# Patient Record
Sex: Female | Born: 1995 | Race: White | Hispanic: No | Marital: Single | Smoking: Never smoker
Health system: Southern US, Community
[De-identification: ages and names within clinical notes are randomized; demographics above are authoritative.]

## PROBLEM LIST (undated history)

## (undated) DIAGNOSIS — D1801 Hemangioma of skin and subcutaneous tissue: Secondary | ICD-10-CM

## (undated) DIAGNOSIS — J45909 Unspecified asthma, uncomplicated: Secondary | ICD-10-CM

## (undated) HISTORY — DX: Unspecified asthma, uncomplicated: J45.909

## (undated) HISTORY — DX: Hemangioma of skin and subcutaneous tissue: D18.01

---
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
## 2017-01-04 ENCOUNTER — Telehealth: Payer: Self-pay | Admitting: Emergency Medicine

## 2017-01-04 MED ORDER — NORETHIN ACE-ETH ESTRAD-FE 1.5-30 MG-MCG PO TABS: 1.0000 | ORAL_TABLET | Freq: Every day | ORAL | 0 refills | Status: DC

## 2017-01-04 NOTE — Telephone Encounter (Signed)
Pt is aware only one pack of bc call into pharm. Pt will make an appt

## 2017-01-04 NOTE — Telephone Encounter (Signed)
Left a voicemail for pt to give the office a call back in reference to needing and appointment to f/u from last OV. Send in birthcontrol but in order for pt to receive further refills she needs an appointment.

## 2017-01-30 ENCOUNTER — Telehealth: Payer: Self-pay | Admitting: Internal Medicine

## 2017-01-30 NOTE — Telephone Encounter (Signed)
Per Dr.Panosh it is okay to schedule pt for that day

## 2017-01-30 NOTE — Telephone Encounter (Signed)
Pt scheduled  

## 2017-01-30 NOTE — Telephone Encounter (Signed)
MAILBOX full

## 2017-01-30 NOTE — Telephone Encounter (Signed)
Pt will be home from college on 02-08-17. Pt is out of bcp and will needs a cpx. Can I use wcc slot  on 02-09-17 ?

## 2017-02-08 NOTE — Progress Notes (Signed)
Chief Complaint  Patient presents with  . Annual Exam    HPI: Patient  Lori Pineda  21 y.o. comes in today for Preventive Health Care visit  Asthma  As needed   q oweek  Pre exervcise  OCPs helpful for dysmenorrhea. 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 it went well this summer.work   Stage manager Date Due  . HIV Screening  02/09/2018 (Originally 12/19/2012)  . INFLUENZA VACCINE  05/03/2017  . TETANUS/TDAP  01/07/2019   Health Maintenance Review LIFESTYLE:  Exercise:   3 d per week  Tobacco/ETS:n Alcohol: n Sugar beverages: no Sleep: 7-8 Drug use: no HH of  Summer 5  To live apt .  Work:  H (Home) Family Relationships: good Communication: good with parents Responsibilities: has responsibilities at home  E (Education): Grades:  Good  School: good attendance  major forensics    A (Auton/Safety) Auto: wears seat belt Bike: does not ride Safety: can swim  D (Diet) Diet: not high risk r Risky eating habits: none Intake: adequate iron and calcium intake Body Image: good   Drugs Tobacco: No Alcohol: No Drugs: No  Sex Activity: abstinent Periods  Reg  And better   4 days  .  Suicide Risk Emotions: healthy Depression: denies feelings of depression Suicidal: denies suicidal ideation    ROS:  GEN/ HEENT: No fever, significant weight changes sweats headaches vision problems hearing changes, CV/ PULM; No chest pain shortness of breath cough, syncope,edema  change in exercise tolerance. GI /GU: No adominal pain, vomiting, change in bowel habits. No blood in the stool. No significant GU symptoms. SKIN/HEME: ,no acute skin rashes suspicious lesions or bleeding. No lymphadenopathy, nodules, masses.  NEURO/ PSYCH:  No neurologic signs such as weakness numbness. No depression anxiety. IMM/ Allergy: No unusual infections.  Allergy .   hasnt seen allergist for a while doing well.  REST of 12 system review  negative except as per HPI   Past Medical History:  Diagnosis Date  . Allergic asthma 04/30/2011   Evaluated by allergy and on inhaler as needed and Singulair.  A candidate for shots .   Marland Kitchen Hemangioma of eyelid   . Twin, born in hospital     History reviewed. No pertinent surgical history.  Family History  Problem Relation Age of Onset  . Multiple sclerosis Mother     Social History   Social History  . Marital status: Single    Spouse name: N/A  . Number of children: N/A  . Years of education: N/A   Social History Main Topics  . Smoking status: Never Smoker  . Smokeless tobacco: Never Used  . Alcohol use No  . Drug use: No  . Sexual activity: No   Other Topics Concern  . None   Social History Narrative   HH of 5   Intact family   [facility]    Outpatient Medications Prior to Visit  Medication Sig Dispense Refill  . albuterol (PROVENTIL HFA;VENTOLIN HFA) 108 (90 Base) MCG/ACT inhaler Inhale 2 puffs into the lungs every 6 (six) hours as needed for wheezing or shortness of breath. 1 Inhaler 2  . budesonide-formoterol (SYMBICORT) 160-4.5 MCG/ACT inhaler Inhale 2 puffs into the lungs 2 (two) times daily. 1 Inhaler 5  . norethindrone-ethinyl estradiol-iron (MICROGESTIN FE,GILDESS FE,LOESTRIN FE) 1.5-30 MG-MCG tablet Take 1 tablet by mouth daily. 1 Package 0   No facility-administered medications prior to visit.      EXAM:  BP 118/70 (BP Location: Right Arm, Patient Position: Sitting, Cuff Size: Normal)   Pulse 67   Temp 97.5 F (36.4 C) (Oral)   Ht _0  (1.702 m)   Wt 155 lb 14.4 oz (70.7 kg)   LMP 02/08/2017   BMI 24.42 kg/m   Body mass index is 24.42 kg/m. Wt Readings from Last 3 Encounters:  02/09/17 155 lb 14.4 oz (70.7 kg) (86 %, Z= 1.07)*  05/12/16 159 lb 6.4 oz (72.3 kg) (89 %, Z= 1.23)*  10/21/15 150 lb (68 kg) (85 %, Z= 1.02)*   * Growth percentiles are based on CDC 2-20 Years data.    Physical Exam: Vital signs reviewed SHU:OHFG is a  well-developed well-nourished alert cooperative    who appearsr stated age in no acute distress.  HEENT: normocephalic atraumatic , Eyes: PERRL EOM's full, conjunctiva clear, Nares: paten,t no deformity discharge or tenderness., Ears: no deformity EAC's clear TMs with normal landmarks. Mouth: clear OP, no lesions, edema.  Moist mucous membranes. Dentition in adequate repair. NECK: supple without masses, thyromegaly or bruits. CHEST/PULM:  Clear to auscultation and percussion breath sounds equal no wheeze , rales or rhonchi. No chest wall deformities or tenderness. Breast: normal by inspection . No dimpling, discharge, masses, tenderness or discharge . CV: PMI is nondisplaced, S1 S2 no gallops, murmurs, rubs. Peripheral pulses are full without delay.No JVD .  ABDOMEN: Bowel sounds normal nontender  No guard or rebound, no hepato splenomegal no CVA tenderness.  No hernia. Extremtities:  No clubbing cyanosis or edema, no acute joint swelling or redness no focal atrophy NEURO:  Oriented x3, cranial nerves 3-12 appear to be intact, no obvious focal weakness,gait within normal limits no abnormal reflexes or asymmetrical SKIN: No acute rashes normal turgor, color, no bruising or petechiae. PSYCH: Oriented, good eye contact, no obvious depression anxiety, cognition and judgment appear normal. LN: no cervical axillary inguinal adenopathy    BP Readings from Last 3 Encounters:  02/09/17 118/70  05/12/16 120/60  10/21/15 118/75    Lab results reviewed with patient   ASSESSMENT AND PLAN:  Discussed the following assessment and plan:  Visit for preventive health examination - Plan: CBC with Differential/Platelet, Lipid panel, Comprehensive metabolic panel  Family history of diabetes mellitus - Plan: Lipid panel, Comprehensive metabolic panel  Need for lipid screening - Plan: Lipid panel  Medication management - cont ocp use  and  meds as discussed  - Plan: CBC with Differential/Platelet, Lipid  panel, Comprehensive metabolic panel Lipid blood sugar anemia screening today if okay then as indicated. Continue attention to healthy diet and exercise and getting enough sleep. Skin cancer prevention for the summer. Checkup in a year or as needed. Her immunizations appear to be up-to-date. Have pharmacy Contact us for refill med as needed  Patient Care Team: Rainen Vanrossum, Standley Brooking, MD as PCP - General Mosetta Anis, MD (Allergy) Patient Instructions   Will notify you  of labs when available.  Sign up for my chart may be helpful for communication.  Continue lifestyle intervention healthy eating and exercise .   Healthy lifestyle includes : At least 150 minutes of exercise weeks  , weight at healthy levels, which is usually   BMI 19-25. Avoid trans fats and processed foods;  Increase fresh fruits and veges to 5 servings per day. And avoid sweet beverages including tea and juice. Mediterranean diet with olive oil and nuts have been noted to be heart and brain healthy . Avoid tobacco products . Limit  alcohol to  7 per week for women and 14 servings for men.  Get adequate sleep . Wear seat belts . Don't text and drive .   Health Maintenance, Female Adopting a healthy lifestyle and getting preventive care can go a long way to promote health and wellness. Talk with your health care provider about what schedule of regular examinations is right for you. This is a good chance for you to check in with your provider about disease prevention and staying healthy. In between checkups, there are plenty of things you can do on your own. Experts have done a lot of research about which lifestyle changes and preventive measures are most likely to keep you healthy. Ask your health care provider for more information. Weight and diet Eat a healthy diet  Be sure to include plenty of vegetables, fruits, low-fat dairy products, and lean protein.  Do not eat a lot of foods high in solid fats, added sugars, or  salt.  Get regular exercise. This is one of the most important things you can do for your health.  Most adults should exercise for at least 150 minutes each week. The exercise should increase your heart rate and make you sweat (moderate-intensity exercise).  Most adults should also do strengthening exercises at least twice a week. This is in addition to the moderate-intensity exercise. Maintain a healthy weight  Body mass index (BMI) is a measurement that can be used to identify possible weight problems. It estimates body fat based on height and weight. Your health care provider can help determine your BMI and help you achieve or maintain a healthy weight.  For females 19 years of age and older:  A BMI below 18.5 is considered underweight.  A BMI of 18.5 to 24.9 is normal.  A BMI of 25 to 29.9 is considered overweight.  A BMI of 30 and above is considered obese. Watch levels of cholesterol and blood lipids  You should start having your blood tested for lipids and cholesterol at 19 years of age, then have this test every 5 years.  You may need to have your cholesterol levels checked more often if:  Your lipid or cholesterol levels are high.  You are older than 19 years of age.  You are at high risk for heart disease. Cancer screening Lung Cancer  Lung cancer screening is recommended for adults 53-86 years old who are at high risk for lung cancer because of a history of smoking.  A yearly low-dose CT scan of the lungs is recommended for people who:  Currently smoke.  Have quit within the past 15 years.  Have at least a 30-pack-year history of smoking. A pack year is smoking an average of one pack of cigarettes a day for 1 year.  Yearly screening should continue until it has been 15 years since you quit.  Yearly screening should stop if you develop a health problem that would prevent you from having lung cancer treatment. Breast Cancer  Practice breast self-awareness.  This means understanding how your breasts normally appear and feel.  It also means doing regular breast self-exams. Let your health care provider know about any changes, no matter how small.  If you are in your 20s or 30s, you should have a clinical breast exam (CBE) by a health care provider every 1-3 years as part of a regular health exam.  If you are 43 or older, have a CBE every year. Also consider having a breast X-ray (mammogram) every year.  If you have a family history of breast cancer, talk to your health care provider about genetic screening.  If you are at high risk for breast cancer, talk to your health care provider about having an MRI and a mammogram every year.  Breast cancer gene (BRCA) assessment is recommended for women who have family members with BRCA-related cancers. BRCA-related cancers include:  Breast.  Ovarian.  Tubal.  Peritoneal cancers.  Results of the assessment will determine the need for genetic counseling and BRCA1 and BRCA2 testing. Cervical Cancer  Your health care provider may recommend that you be screened regularly for cancer of the pelvic organs (ovaries, uterus, and vagina). This screening involves a pelvic examination, including checking for microscopic changes to the surface of your cervix (Pap test). You may be encouraged to have this screening done every 3 years, beginning at age 28.  For women ages 7-65, health care providers may recommend pelvic exams and Pap testing every 3 years, or they may recommend the Pap and pelvic exam, combined with testing for human papilloma virus (HPV), every 5 years. Some types of HPV increase your risk of cervical cancer. Testing for HPV may also be done on women of any age with unclear Pap test results.  Other health care providers may not recommend any screening for nonpregnant women who are considered low risk for pelvic cancer and who do not have symptoms. Ask your health care provider if a screening pelvic  exam is right for you.  If you have had past treatment for cervical cancer or a condition that could lead to cancer, you need Pap tests and screening for cancer for at least 20 years after your treatment. If Pap tests have been discontinued, your risk factors (such as having a new sexual partner) need to be reassessed to determine if screening should resume. Some women have medical problems that increase the chance of getting cervical cancer. In these cases, your health care provider may recommend more frequent screening and Pap tests. Colorectal Cancer  This type of cancer can be detected and often prevented.  Routine colorectal cancer screening usually begins at 19 years of age and continues through 19 years of age.  Your health care provider may recommend screening at an earlier age if you have risk factors for colon cancer.  Your health care provider may also recommend using home test kits to check for hidden blood in the stool.  A small camera at the end of a tube can be used to examine your colon directly (sigmoidoscopy or colonoscopy). This is done to check for the earliest forms of colorectal cancer.  Routine screening usually begins at age 85.  Direct examination of the colon should be repeated every 5-10 years through 19 years of age. However, you may need to be screened more often if early forms of precancerous polyps or small growths are found. Skin Cancer  Check your skin from head to toe regularly.  Tell your health care provider about any new moles or changes in moles, especially if there is a change in a mole's shape or color.  Also tell your health care provider if you have a mole that is larger than the size of a pencil eraser.  Always use sunscreen. Apply sunscreen liberally and repeatedly throughout the day.  Protect yourself by wearing long sleeves, pants, a wide-brimmed hat, and sunglasses whenever you are outside. Heart disease, diabetes, and high blood  pressure  High blood pressure causes heart disease and increases the risk  of stroke. High blood pressure is more likely to develop in:  People who have blood pressure in the high end of the normal range (130-139/85-89 mm Hg).  People who are overweight or obese.  People who are African American.  If you are 17-27 years of age, have your blood pressure checked every 3-5 years. If you are 11 years of age or older, have your blood pressure checked every year. You should have your blood pressure measured twice-once when you are at a hospital or clinic, and once when you are not at a hospital or clinic. Record the average of the two measurements. To check your blood pressure when you are not at a hospital or clinic, you can use:  An automated blood pressure machine at a pharmacy.  A home blood pressure monitor.  If you are between 4 years and 70 years old, ask your health care provider if you should take aspirin to prevent strokes.  Have regular diabetes screenings. This involves taking a blood sample to check your fasting blood sugar level.  If you are at a normal weight and have a low risk for diabetes, have this test once every three years after 19 years of age.  If you are overweight and have a high risk for diabetes, consider being tested at a younger age or more often. Preventing infection Hepatitis B  If you have a higher risk for hepatitis B, you should be screened for this virus. You are considered at high risk for hepatitis B if:  You were born in a country where hepatitis B is common. Ask your health care provider which countries are considered high risk.  Your parents were born in a high-risk country, and you have not been immunized against hepatitis B (hepatitis B vaccine).  You have HIV or AIDS.  You use needles to inject street drugs.  You live with someone who has hepatitis B.  You have had sex with someone who has hepatitis B.  You get hemodialysis  treatment.  You take certain medicines for conditions, including cancer, organ transplantation, and autoimmune conditions. Hepatitis C  Blood testing is recommended for:  Everyone born from [birth year] through 1963.  Anyone with known risk factors for hepatitis C. Sexually transmitted infections (STIs)  You should be screened for sexually transmitted infections (STIs) including gonorrhea and chlamydia if:  You are sexually active and are younger than 19 years of age.  You are older than 19 years of age and your health care provider tells you that you are at risk for this type of infection.  Your sexual activity has changed since you were last screened and you are at an increased risk for chlamydia or gonorrhea. Ask your health care provider if you are at risk.  If you do not have HIV, but are at risk, it may be recommended that you take a prescription medicine daily to prevent HIV infection. This is called pre-exposure prophylaxis (PrEP). You are considered at risk if:  You are sexually active and do not regularly use condoms or know the HIV status of your partner(s).  You take drugs by injection.  You are sexually active with a partner who has HIV. Talk with your health care provider about whether you are at high risk of being infected with HIV. If you choose to begin PrEP, you should first be tested for HIV. You should then be tested every 3 months for as long as you are taking PrEP. Pregnancy  If you are premenopausal  and you may become pregnant, ask your health care provider about preconception counseling.  If you may become pregnant, take 400 to 800 micrograms (mcg) of folic acid every day.  If you want to prevent pregnancy, talk to your health care provider about birth control (contraception). Osteoporosis and menopause  Osteoporosis is a disease in which the bones lose minerals and strength with aging. This can result in serious bone fractures. Your risk for osteoporosis can be  identified using a bone density scan.  If you are 76 years of age or older, or if you are at risk for osteoporosis and fractures, ask your health care provider if you should be screened.  Ask your health care provider whether you should take a calcium or vitamin D supplement to lower your risk for osteoporosis.  Menopause may have certain physical symptoms and risks.  Hormone replacement therapy may reduce some of these symptoms and risks. Talk to your health care provider about whether hormone replacement therapy is right for you. Follow these instructions at home:  Schedule regular health, dental, and eye exams.  Stay current with your immunizations.  Do not use any tobacco products including cigarettes, chewing tobacco, or electronic cigarettes.  If you are pregnant, do not drink alcohol.  If you are breastfeeding, limit how much and how often you drink alcohol.  Limit alcohol intake to no more than 1 drink per day for nonpregnant women. One drink equals 12 ounces of beer, 5 ounces of wine, or 1 ounces of hard liquor.  Do not use street drugs.  Do not share needles.  Ask your health care provider for help if you need support or information about quitting drugs.  Tell your health care provider if you often feel depressed.  Tell your health care provider if you have ever been abused or do not feel safe at home. This information is not intended to replace advice given to you by your health care provider. Make sure you discuss any questions you have with your health care provider. Document Released: 04/04/2011 Document Revised: 02/25/2016 Document Reviewed: 06/23/2015 Elsevier Interactive Patient Education  2017 [employer].  Immunization History  Administered Date(s) Administered  . HPV Quadrivalent 05/25/2011, 07/29/2011, 11/30/2011  . Hepatitis A 05/28/2010, 04/26/2011  . Influenza Split 07/29/2011  . Meningococcal Polysaccharide 05/28/2010  . Td 01/06/2009       Standley Brooking. Sears Oran M.D.

## 2017-02-09 ENCOUNTER — Ambulatory Visit (INDEPENDENT_AMBULATORY_CARE_PROVIDER_SITE_OTHER): Payer: BLUE CROSS/BLUE SHIELD | Admitting: Internal Medicine

## 2017-02-09 ENCOUNTER — Encounter: Payer: Self-pay | Admitting: Internal Medicine

## 2017-02-09 VITALS — BP 118/70 | HR 67 | Temp 97.5°F | Ht 67.0 in | Wt 155.9 lb

## 2017-02-09 DIAGNOSIS — Z0000 Encounter for general adult medical examination without abnormal findings: Secondary | ICD-10-CM | POA: Diagnosis not present

## 2017-02-09 DIAGNOSIS — Z13220 Encounter for screening for lipoid disorders: Secondary | ICD-10-CM | POA: Diagnosis not present

## 2017-02-09 DIAGNOSIS — Z833 Family history of diabetes mellitus: Secondary | ICD-10-CM

## 2017-02-09 DIAGNOSIS — Z79899 Other long term (current) drug therapy: Secondary | ICD-10-CM | POA: Diagnosis not present

## 2017-02-09 LAB — CBC WITH DIFFERENTIAL/PLATELET
Basophils Absolute: 0 10*3/uL (ref 0.0–0.1)
Basophils Relative: 0.5 % (ref 0.0–3.0)
EOS PCT: 4.1 % (ref 0.0–5.0)
Eosinophils Absolute: 0.3 10*3/uL (ref 0.0–0.7)
HEMATOCRIT: 40.3 % (ref 36.0–49.0)
HEMOGLOBIN: 13.4 g/dL (ref 12.0–16.0)
LYMPHS PCT: 31.3 % (ref 24.0–48.0)
Lymphs Abs: 2.2 10*3/uL (ref 0.7–4.0)
MCHC: 33.1 g/dL (ref 31.0–37.0)
MCV: 92.2 fl (ref 78.0–98.0)
MONOS PCT: 11 % (ref 3.0–12.0)
Monocytes Absolute: 0.8 10*3/uL (ref 0.1–1.0)
Neutro Abs: 3.8 10*3/uL (ref 1.4–7.7)
Neutrophils Relative %: 53.1 % (ref 43.0–71.0)
PLATELETS: 306 10*3/uL (ref 150.0–575.0)
RBC: 4.37 Mil/uL (ref 3.80–5.70)
RDW: 13.2 % (ref 11.4–15.5)
WBC: 7.1 10*3/uL (ref 4.5–13.5)

## 2017-02-09 LAB — COMPREHENSIVE METABOLIC PANEL
ALBUMIN: 4.4 g/dL (ref 3.5–5.2)
ALT: 6 U/L (ref 0–35)
AST: 15 U/L (ref 0–37)
Alkaline Phosphatase: 41 U/L — ABNORMAL LOW (ref 47–119)
BILIRUBIN TOTAL: 0.3 mg/dL (ref 0.2–1.2)
BUN: 9 mg/dL (ref 6–23)
CALCIUM: 9.6 mg/dL (ref 8.4–10.5)
CO2: 26 meq/L (ref 19–32)
Chloride: 105 mEq/L (ref 96–112)
Creatinine, Ser: 0.86 mg/dL (ref 0.40–1.20)
GFR: 90.21 mL/min (ref >60.00)
Glucose, Bld: 94 mg/dL (ref 70–99)
Potassium: 4.7 mEq/L (ref 3.5–5.1)
SODIUM: 140 meq/L (ref 135–145)
Total Protein: 7.3 g/dL (ref 6.0–8.3)

## 2017-02-09 LAB — LIPID PANEL
CHOLESTEROL: 161 mg/dL (ref 0–200)
HDL: 50.6 mg/dL (ref >39.00)
LDL Cholesterol: 96 mg/dL (ref 0–99)
NonHDL: 110.17
TRIGLYCERIDES: 73 mg/dL (ref 0.0–149.0)
Total CHOL/HDL Ratio: 3
VLDL: 14.6 mg/dL (ref 0.0–40.0)

## 2017-02-09 NOTE — Patient Instructions (Addendum)
Will notify you  of labs when available.  Sign up for my chart may be helpful for communication.  Continue lifestyle intervention healthy eating and exercise .   Healthy lifestyle includes : At least 150 minutes of exercise weeks  , weight at healthy levels, which is usually   BMI 19-25. Avoid trans fats and processed foods;  Increase fresh fruits and veges to 5 servings per day. And avoid sweet beverages including tea and juice. Mediterranean diet with olive oil and nuts have been noted to be heart and brain healthy . Avoid tobacco products . Limit  alcohol to  7 per week for women and 14 servings for men.  Get adequate sleep . Wear seat belts . Don't text and drive .   Health Maintenance, Female Adopting a healthy lifestyle and getting preventive care can go a long way to promote health and wellness. Talk with your health care provider about what schedule of regular examinations is right for you. This is a good chance for you to check in with your provider about disease prevention and staying healthy. In between checkups, there are plenty of things you can do on your own. Experts have done a lot of research about which lifestyle changes and preventive measures are most likely to keep you healthy. Ask your health care provider for more information. Weight and diet Eat a healthy diet  Be sure to include plenty of vegetables, fruits, low-fat dairy products, and lean protein.  Do not eat a lot of foods high in solid fats, added sugars, or salt.  Get regular exercise. This is one of the most important things you can do for your health.  Most adults should exercise for at least 150 minutes each week. The exercise should increase your heart rate and make you sweat (moderate-intensity exercise).  Most adults should also do strengthening exercises at least twice a week. This is in addition to the moderate-intensity exercise. Maintain a healthy weight  Body mass index (BMI) is a measurement  that can be used to identify possible weight problems. It estimates body fat based on height and weight. Your health care provider can help determine your BMI and help you achieve or maintain a healthy weight.  For females 47 years of age and older:  A BMI below 18.5 is considered underweight.  A BMI of 18.5 to 24.9 is normal.  A BMI of 25 to 29.9 is considered overweight.  A BMI of 30 and above is considered obese. Watch levels of cholesterol and blood lipids  You should start having your blood tested for lipids and cholesterol at 19 years of age, then have this test every 5 years.  You may need to have your cholesterol levels checked more often if:  Your lipid or cholesterol levels are high.  You are older than 19 years of age.  You are at high risk for heart disease. Cancer screening Lung Cancer  Lung cancer screening is recommended for adults 41-38 years old who are at high risk for lung cancer because of a history of smoking.  A yearly low-dose CT scan of the lungs is recommended for people who:  Currently smoke.  Have quit within the past 15 years.  Have at least a 30-pack-year history of smoking. A pack year is smoking an average of one pack of cigarettes a day for 1 year.  Yearly screening should continue until it has been 15 years since you quit.  Yearly screening should stop if you develop a  health problem that would prevent you from having lung cancer treatment. Breast Cancer  Practice breast self-awareness. This means understanding how your breasts normally appear and feel.  It also means doing regular breast self-exams. Let your health care provider know about any changes, no matter how small.  If you are in your 20s or 30s, you should have a clinical breast exam (CBE) by a health care provider every 1-3 years as part of a regular health exam.  If you are 60 or older, have a CBE every year. Also consider having a breast X-ray (mammogram) every year.  If  you have a family history of breast cancer, talk to your health care provider about genetic screening.  If you are at high risk for breast cancer, talk to your health care provider about having an MRI and a mammogram every year.  Breast cancer gene (BRCA) assessment is recommended for women who have family members with BRCA-related cancers. BRCA-related cancers include:  Breast.  Ovarian.  Tubal.  Peritoneal cancers.  Results of the assessment will determine the need for genetic counseling and BRCA1 and BRCA2 testing. Cervical Cancer  Your health care provider may recommend that you be screened regularly for cancer of the pelvic organs (ovaries, uterus, and vagina). This screening involves a pelvic examination, including checking for microscopic changes to the surface of your cervix (Pap test). You may be encouraged to have this screening done every 3 years, beginning at age 33.  For women ages 54-65, health care providers may recommend pelvic exams and Pap testing every 3 years, or they may recommend the Pap and pelvic exam, combined with testing for human papilloma virus (HPV), every 5 years. Some types of HPV increase your risk of cervical cancer. Testing for HPV may also be done on women of any age with unclear Pap test results.  Other health care providers may not recommend any screening for nonpregnant women who are considered low risk for pelvic cancer and who do not have symptoms. Ask your health care provider if a screening pelvic exam is right for you.  If you have had past treatment for cervical cancer or a condition that could lead to cancer, you need Pap tests and screening for cancer for at least 20 years after your treatment. If Pap tests have been discontinued, your risk factors (such as having a new sexual partner) need to be reassessed to determine if screening should resume. Some women have medical problems that increase the chance of getting cervical cancer. In these cases,  your health care provider may recommend more frequent screening and Pap tests. Colorectal Cancer  This type of cancer can be detected and often prevented.  Routine colorectal cancer screening usually begins at 19 years of age and continues through 19 years of age.  Your health care provider may recommend screening at an earlier age if you have risk factors for colon cancer.  Your health care provider may also recommend using home test kits to check for hidden blood in the stool.  A small camera at the end of a tube can be used to examine your colon directly (sigmoidoscopy or colonoscopy). This is done to check for the earliest forms of colorectal cancer.  Routine screening usually begins at age 50.  Direct examination of the colon should be repeated every 5-10 years through 19 years of age. However, you may need to be screened more often if early forms of precancerous polyps or small growths are found. Skin Cancer  Check  your skin from head to toe regularly.  Tell your health care provider about any new moles or changes in moles, especially if there is a change in a mole's shape or color.  Also tell your health care provider if you have a mole that is larger than the size of a pencil eraser.  Always use sunscreen. Apply sunscreen liberally and repeatedly throughout the day.  Protect yourself by wearing long sleeves, pants, a wide-brimmed hat, and sunglasses whenever you are outside. Heart disease, diabetes, and high blood pressure  High blood pressure causes heart disease and increases the risk of stroke. High blood pressure is more likely to develop in:  People who have blood pressure in the high end of the normal range (130-139/85-89 mm Hg).  People who are overweight or obese.  People who are African American.  If you are 51-8 years of age, have your blood pressure checked every 3-5 years. If you are 39 years of age or older, have your blood pressure checked every year. You  should have your blood pressure measured twice-once when you are at a hospital or clinic, and once when you are not at a hospital or clinic. Record the average of the two measurements. To check your blood pressure when you are not at a hospital or clinic, you can use:  An automated blood pressure machine at a pharmacy.  A home blood pressure monitor.  If you are between 17 years and 21 years old, ask your health care provider if you should take aspirin to prevent strokes.  Have regular diabetes screenings. This involves taking a blood sample to check your fasting blood sugar level.  If you are at a normal weight and have a low risk for diabetes, have this test once every three years after 18 years of age.  If you are overweight and have a high risk for diabetes, consider being tested at a younger age or more often. Preventing infection Hepatitis B  If you have a higher risk for hepatitis B, you should be screened for this virus. You are considered at high risk for hepatitis B if:  You were born in a country where hepatitis B is common. Ask your health care provider which countries are considered high risk.  Your parents were born in a high-risk country, and you have not been immunized against hepatitis B (hepatitis B vaccine).  You have HIV or AIDS.  You use needles to inject street drugs.  You live with someone who has hepatitis B.  You have had sex with someone who has hepatitis B.  You get hemodialysis treatment.  You take certain medicines for conditions, including cancer, organ transplantation, and autoimmune conditions. Hepatitis C  Blood testing is recommended for:  Everyone born from [birth year] through 1963.  Anyone with known risk factors for hepatitis C. Sexually transmitted infections (STIs)  You should be screened for sexually transmitted infections (STIs) including gonorrhea and chlamydia if:  You are sexually active and are younger than 19 years of age.  You are  older than 19 years of age and your health care provider tells you that you are at risk for this type of infection.  Your sexual activity has changed since you were last screened and you are at an increased risk for chlamydia or gonorrhea. Ask your health care provider if you are at risk.  If you do not have HIV, but are at risk, it may be recommended that you take a prescription medicine daily to prevent  HIV infection. This is called pre-exposure prophylaxis (PrEP). You are considered at risk if:  You are sexually active and do not regularly use condoms or know the HIV status of your partner(s).  You take drugs by injection.  You are sexually active with a partner who has HIV. Talk with your health care provider about whether you are at high risk of being infected with HIV. If you choose to begin PrEP, you should first be tested for HIV. You should then be tested every 3 months for as long as you are taking PrEP. Pregnancy  If you are premenopausal and you may become pregnant, ask your health care provider about preconception counseling.  If you may become pregnant, take 400 to 800 micrograms (mcg) of folic acid every day.  If you want to prevent pregnancy, talk to your health care provider about birth control (contraception). Osteoporosis and menopause  Osteoporosis is a disease in which the bones lose minerals and strength with aging. This can result in serious bone fractures. Your risk for osteoporosis can be identified using a bone density scan.  If you are 33 years of age or older, or if you are at risk for osteoporosis and fractures, ask your health care provider if you should be screened.  Ask your health care provider whether you should take a calcium or vitamin D supplement to lower your risk for osteoporosis.  Menopause may have certain physical symptoms and risks.  Hormone replacement therapy may reduce some of these symptoms and risks. Talk to your health care provider  about whether hormone replacement therapy is right for you. Follow these instructions at home:  Schedule regular health, dental, and eye exams.  Stay current with your immunizations.  Do not use any tobacco products including cigarettes, chewing tobacco, or electronic cigarettes.  If you are pregnant, do not drink alcohol.  If you are breastfeeding, limit how much and how often you drink alcohol.  Limit alcohol intake to no more than 1 drink per day for nonpregnant women. One drink equals 12 ounces of beer, 5 ounces of wine, or 1 ounces of hard liquor.  Do not use street drugs.  Do not share needles.  Ask your health care provider for help if you need support or information about quitting drugs.  Tell your health care provider if you often feel depressed.  Tell your health care provider if you have ever been abused or do not feel safe at home. This information is not intended to replace advice given to you by your health care provider. Make sure you discuss any questions you have with your health care provider. Document Released: 04/04/2011 Document Revised: 02/25/2016 Document Reviewed: 06/23/2015 Elsevier Interactive Patient Education  2017 [employer].  Immunization History  Administered Date(s) Administered  . HPV Quadrivalent 05/25/2011, 07/29/2011, 11/30/2011  . Hepatitis A 05/28/2010, 04/26/2011  . Influenza Split 07/29/2011  . Meningococcal Polysaccharide 05/28/2010  . Td 01/06/2009

## 2017-02-27 ENCOUNTER — Other Ambulatory Visit: Payer: Self-pay | Admitting: Internal Medicine

## 2017-02-28 NOTE — Telephone Encounter (Signed)
Sent to the pharmacy by e-scribe.  Pt has cpx 02/09/17.  Due back in 1 year.

## 2017-03-31 ENCOUNTER — Encounter: Payer: Self-pay | Admitting: Internal Medicine

## 2017-03-31 ENCOUNTER — Encounter: Payer: Self-pay | Admitting: Emergency Medicine

## 2017-03-31 ENCOUNTER — Ambulatory Visit (INDEPENDENT_AMBULATORY_CARE_PROVIDER_SITE_OTHER): Payer: BLUE CROSS/BLUE SHIELD | Admitting: Internal Medicine

## 2017-03-31 VITALS — BP 110/80 | HR 66 | Temp 98.2°F | Wt 158.4 lb

## 2017-03-31 DIAGNOSIS — Z973 Presence of spectacles and contact lenses: Secondary | ICD-10-CM

## 2017-03-31 DIAGNOSIS — H109 Unspecified conjunctivitis: Secondary | ICD-10-CM | POA: Diagnosis not present

## 2017-03-31 DIAGNOSIS — H5712 Ocular pain, left eye: Secondary | ICD-10-CM | POA: Diagnosis not present

## 2017-03-31 MED ORDER — MOXIFLOXACIN HCL 0.5 % OP SOLN: 1.0000 [drp] | Freq: Three times a day (TID) | OPHTHALMIC | 0 refills | Status: AC

## 2017-03-31 NOTE — Progress Notes (Signed)
Chief Complaint  Patient presents with  . Conjunctivitis    both eyes   . Emesis    HPI: Lori Pineda 21 y.o.  sda Here with mom. She does wear contacts takes them at night. Last night she woke up at 3 AM with severe eye pain mostly on the last noted both eyes were red. She had some episodes of vomiting not sure if it was related or eating something badly no associated headache. She's wearing her glasses today eyes were puffy little less has some tearing in her left eye with some crusting no photophobia or eye pain is improved. No upper respiratory infection or sinus symptoms. No new contacts foreign body feeling in the high or history of similar although pinkeye is been around according to mom. 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 No unusual sun exposure and wears glasses. Her ophthalmologist in Dr. Annamaria Boots but needs to transition to an adult ophthalmology practice. ROS: See pertinent positives and negatives per HPI.  Past Medical History:  Diagnosis Date  . Allergic asthma 04/30/2011   Evaluated by allergy and on inhaler as needed and Singulair.  A candidate for shots .   Marland Kitchen Hemangioma of eyelid   . Twin, born in hospital     Family History  Problem Relation Age of Onset  . Multiple sclerosis Mother     Social History   Social History  . Marital status: Single    Spouse name: N/A  . Number of children: N/A  . Years of education: N/A   Social History Main Topics  . Smoking status: Never Smoker  . Smokeless tobacco: Never Used  . Alcohol use No  . Drug use: No  . Sexual activity: No   Other Topics Concern  . None   Social History Narrative   HH of 5   Intact family   [employer]    Outpatient Medications Prior to Visit  Medication Sig Dispense Refill  . albuterol (PROVENTIL HFA;VENTOLIN HFA) 108 (90 Base) MCG/ACT inhaler Inhale 2 puffs into the lungs every 6 (six) hours as needed for wheezing or shortness of breath. 1 Inhaler 2  . budesonide-formoterol  (SYMBICORT) 160-4.5 MCG/ACT inhaler Inhale 2 puffs into the lungs 2 (two) times daily. 1 Inhaler 5  . JUNEL FE 1.5/30 1.5-30 MG-MCG tablet TAKE 1 TABLET BY MOUTH EVERY DAY 28 tablet 11  . norethindrone-ethinyl estradiol-iron (MICROGESTIN FE,GILDESS FE,LOESTRIN FE) 1.5-30 MG-MCG tablet Take 1 tablet by mouth daily. 1 Package 0   No facility-administered medications prior to visit.      EXAM:  BP 110/80 (BP Location: Right Arm, Patient Position: Sitting, Cuff Size: Normal)   Pulse 66   Temp 98.2 F (36.8 C) (Oral)   Wt 158 lb 6.4 oz (71.8 kg)   BMI 24.81 kg/m   Body mass index is 24.81 kg/m.  GENERAL: vitals reviewed and listed above, alert, oriented, appears well hydrated and in no acute distress HEENT: atraumatic, Obvious pink eyes +2-3 red on the left without edema +1 on the right. EOMs are full PErl no obvious ciliary flush; no severe photophobia although does get tearing. No obvious foreign body. [facility]  With galsses no sig abnormaliteis  TMs are clear OP is clear neck supple without adenopathy. No obvious rashes face or eyelids. Neurologic intact. MS: moves all extremities without noticeable focal  abnormality PSYCH: pleasant and cooperative, no obvious depression or anxiety  ASSESSMENT AND PLAN:  Discussed the following assessment and plan:  Eye pain, left  Conjunctivitis of both eyes, unspecified conjunctivitis type - left more than right  Uses contact lenses Some concern with a history of eye pain with vomiting however exam is more consistent with conjunctivitis or related inflammation from contacts. Discussed alarm symptoms all up with eye doctor. Treatment at this time no contact use. -Patient advised to return or notify health care team  if symptoms worsen ,persist or new concerns arise.  Patient Instructions  No contacts until all better . Or as per your eye team  Antibiotic drops for now.  If pain comes back or new vision  change or  Severe light sensitivity then   Seek urgent  Recheck .  Sometime  Contacts can cause an irritation scratch of the cornea and infection  .   Call your opthalmology office for a fu visit to be seen for eye pain redness   And plan we discussed       Standley Brooking. Lori Pineda M.D.

## 2017-03-31 NOTE — Patient Instructions (Signed)
No contacts until all better . Or as per your eye team  Antibiotic drops for now.  If pain comes back or new vision  change or  Severe light sensitivity then  Seek urgent  Recheck .  Sometime  Contacts can cause an irritation scratch of the cornea and infection  .   Call your opthalmology office for a fu visit to be seen for eye pain redness   And plan we discussed

## 2018-03-02 ENCOUNTER — Other Ambulatory Visit: Payer: Self-pay | Admitting: Internal Medicine

## 2018-03-02 NOTE — Telephone Encounter (Signed)
Patient need to schedule an ov for more refills. 

## 2018-04-10 ENCOUNTER — Other Ambulatory Visit: Payer: Self-pay | Admitting: Internal Medicine

## 2018-04-14 ENCOUNTER — Other Ambulatory Visit: Payer: Self-pay | Admitting: Internal Medicine

## 2019-08-07 ENCOUNTER — Other Ambulatory Visit: Payer: Self-pay

## 2019-08-07 ENCOUNTER — Emergency Department (HOSPITAL_BASED_OUTPATIENT_CLINIC_OR_DEPARTMENT_OTHER)
Admission: EM | Admit: 2019-08-07 | Discharge: 2019-08-07 | Disposition: A | Discharge status: Home / Self Care | Payer: BLUE CROSS/BLUE SHIELD | Attending: Emergency Medicine | Admitting: Emergency Medicine

## 2019-08-07 ENCOUNTER — Encounter (HOSPITAL_BASED_OUTPATIENT_CLINIC_OR_DEPARTMENT_OTHER): Payer: Self-pay

## 2019-08-07 DIAGNOSIS — J029 Acute pharyngitis, unspecified: Secondary | ICD-10-CM | POA: Insufficient documentation

## 2019-08-07 DIAGNOSIS — J45909 Unspecified asthma, uncomplicated: Secondary | ICD-10-CM | POA: Diagnosis not present

## 2019-08-07 NOTE — ED Triage Notes (Signed)
Pt reports sore throat x 3 days- [facility], [facility] & negative [facility]id. Pt started on prednisone and clindamycin with no relief. Pt reports fevers.

## 2019-08-07 NOTE — ED Provider Notes (Signed)
[name] EMERGENCY DEPARTMENT Provider Note   CSN: [id number] Arrival date & time: 08/07/19  1614     History   Chief Complaint Chief Complaint  Patient presents with  . Sore Throat    HPI Lori Pineda is a 23 y.o. female.     23 yo F with a chief complaint of a sore throat.  Is been going on for about 3 days now.  [facility] and tested for the flu mononucleosis and strep throat and the novel coronavirus and they were all negative.  She was then started on clindamycin and prednisone.  Has been doing this for couple days but feels that her symptoms of may be worsened.  She had a fever for the first couple days but this is resolved.  She denies cough denies headache denies ear pain.  Denies other close contacts with a similar illness.  The history is provided by the patient.  Sore Throat This is a new problem. The current episode started 2 days ago. The problem occurs constantly. The problem has not changed since onset.Pertinent negatives include no chest pain, no abdominal pain, no headaches and no shortness of breath. The symptoms are aggravated by swallowing. Nothing relieves the symptoms. She has tried nothing for the symptoms. The treatment provided no relief.    Past Medical History:  Diagnosis Date  . Allergic asthma 04/30/2011   Evaluated by allergy and on inhaler as needed and Singulair.  A candidate for shots .   Marland Kitchen Hemangioma of eyelid   . Twin, born in hospital     Patient Active Problem List   Diagnosis Date Noted  . Sore throat 10/21/2015  . Excessive sweating, local 01/10/2014  . Dysmenorrhea 01/10/2014  . Headache(784.0) 01/10/2014  . Closed head injury with concussion 01/10/2014  . Well adolescent visit 04/30/2011  . Allergic asthma 04/30/2011  . Hip pain 02/03/2011    History reviewed. No pertinent surgical history.   OB History   No obstetric history on file.      Home Medications    Prior to Admission  medications   Medication Sig Start Date End Date Taking? Authorizing Provider  albuterol (PROVENTIL HFA;VENTOLIN HFA) 108 (90 Base) MCG/ACT inhaler Inhale 2 puffs into the lungs every 6 (six) hours as needed for wheezing or shortness of breath. 05/12/16   Panosh, Standley Brooking, MD  budesonide-formoterol (SYMBICORT) 160-4.5 MCG/ACT inhaler Inhale 2 puffs into the lungs 2 (two) times daily. 05/12/16   Panosh, Standley Brooking, MD  moxifloxacin (VIGAMOX) 0.5 % ophthalmic solution Place 1 drop into both eyes 3 (three) times daily. 03/31/17   Panosh, Standley Brooking, MD  norethindrone-ethinyl estradiol-iron (JUNEL FE 1.5/30) 1.5-30 MG-MCG tablet Take 1 tablet by mouth daily. **PLEASE SCHEDULE ANNUAL VISIT FOR FURTHER REFILLS 03/02/18   Panosh, Standley Brooking, MD    Family History Family History  Problem Relation Age of Onset  . Multiple sclerosis Mother     Social History Social History   Tobacco Use  . Smoking status: Never Smoker  . Smokeless tobacco: Never Used  Substance Use Topics  . Alcohol use: No  . Drug use: No     Allergies   Patient has no known allergies.   Review of Systems Review of Systems  Constitutional: Negative for chills and fever.  HENT: Positive for sore throat. Negative for congestion and rhinorrhea.   Eyes: Negative for redness and visual disturbance.  Respiratory: Negative for shortness of breath and wheezing.  Cardiovascular: Negative for chest pain and palpitations.  Gastrointestinal: Negative for abdominal pain, nausea and vomiting.  Genitourinary: Negative for dysuria and urgency.  Musculoskeletal: Negative for arthralgias and myalgias.  Skin: Negative for pallor and wound.  Neurological: Negative for dizziness and headaches.     Physical Exam Updated Vital Signs BP (!) 133/102 (BP Location: Right Arm)   Pulse (!) 101   Temp 98.7 F (37.1 C) (Oral)   Resp 17   Ht 5\' 6"  (1.676 m)   Wt 68 kg   LMP 08/06/2019   SpO2 99%   BMI 24.21 kg/m   Physical Exam Vitals signs and  nursing note reviewed.  Constitutional:      General: She is not in acute distress.    Appearance: She is well-developed. She is not diaphoretic.  HENT:     Head: Normocephalic and atraumatic.     Comments: Swollen turbinates, posterior nasal drip, tm normal bilaterally.  Posterior pharynx with some erythema and mild swelling of the tonsils bilaterally.  There is exudates on them bilaterally.  Uvula is midline.  Tolerating secretions without difficulty.  No sublingual swelling.  Rotating her head 45 degrees in either direction without symptoms.  Eyes:     Pupils: Pupils are equal, round, and reactive to light.  Neck:     Musculoskeletal: Normal range of motion and neck supple.  Cardiovascular:     Rate and Rhythm: Normal rate and regular rhythm.     Heart sounds: No murmur. No friction rub. No gallop.   Pulmonary:     Effort: Pulmonary effort is normal.     Breath sounds: No wheezing or rales.  Abdominal:     General: There is no distension.     Palpations: Abdomen is soft.     Tenderness: There is no abdominal tenderness.  Musculoskeletal:        General: No tenderness.  Skin:    General: Skin is warm and dry.  Neurological:     Mental Status: She is alert and oriented to person, place, and time.  Psychiatric:        Behavior: Behavior normal.      ED Treatments / Results  Labs (all labs ordered are listed, but only abnormal results are displayed) Labs Reviewed - No data to display  EKG None  Radiology No results found.  Procedures Procedures (including critical care time)  Medications Ordered in ED Medications - No data to display   Initial Impression / Assessment and Plan / ED Course  I have reviewed the triage vital signs and the nursing notes.  Pertinent labs & imaging results that were available during my care of the patient were reviewed by me and considered in my medical decision making (see chart for details).        23 yo F with a chief complaint  of sore throat.  Going on for the past few days.  Clinically she has strep pharyngitis, she was seen a few days ago and treated as if she had strep that had a negative test.  There is a chance that this is viral though with ongoing purulent symptoms I suggest continuing the antibiotics.  Says that she has had some improvement as she has been afebrile today and had a fever the past couple days.  I discussed the common course of strep pharyngitis.  We will have her follow-up with her family doctor in the office.  4:51 PM:  I have discussed the diagnosis/risks/treatment options with the patient  and family and believe the pt to be eligible for discharge home to follow-up with PCP. We also discussed returning to the ED immediately if new or worsening sx occur. We discussed the sx which are most concerning (e.g., sudden worsening pain, fever, inability to tolerate by mouth) that necessitate immediate return. Medications administered to the patient during their visit and any new prescriptions provided to the patient are listed below.  Medications given during this visit Medications - No data to display   The patient appears reasonably screen and/or stabilized for discharge and I doubt any other medical condition or other [facility] [place] requiring further screening, evaluation, or treatment in the ED at this time prior to discharge.    Final Clinical Impressions(s) / ED Diagnoses   Final diagnoses:  Suppurative pharyngitis    ED Discharge Orders    None       Deno Etienne, DO 08/07/19 1651

## 2019-08-07 NOTE — Discharge Instructions (Signed)
Take tylenol 2 pills 4 times a day and motrin 4 pills 3 times a day.  Drink plenty of fluids.  Return for inability to swallow, if you systemically feel worse.  Take the antibiotics and steroids until complete.   Keep  eating and drinking, this is important to lubricate the throat and will aid in your improvement.

## 2019-08-07 NOTE — ED Notes (Signed)
ED Provider at bedside.
